# Patient Record
Sex: Male | Born: 1990 | Race: White | Hispanic: No | Marital: Single | Smoking: Never smoker
Health system: Southern US, Community
[De-identification: ages and names within clinical notes are randomized; demographics above are authoritative.]

---
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
## 2022-07-25 ENCOUNTER — Encounter: Payer: Commercial Managed Care - PPO | Admitting: Nurse Practitioner

## 2022-07-25 DIAGNOSIS — Z0000 Encounter for general adult medical examination without abnormal findings: Secondary | ICD-10-CM | POA: Insufficient documentation

## 2022-07-25 NOTE — Progress Notes (Unsigned)
  Bethanie Dicker, NP-C Phone: [phone]  Brandon Leon is a 32 y.o. male who presents today to establish care and for annual exam.  Diet: *** Exercise: *** Family history-  Prostate cancer: ***  Colon cancer: *** Sexually active: *** Vaccines-   Flu: ***  Tetanus: ***  COVID19: *** HIV screening: *** Hep C Screening: *** Tobacco use: *** Alcohol use: *** Illicit Drug use: *** Dentist: *** Ophthalmology: ***   Active Ambulatory Problems    Diagnosis Date Noted   No Active Ambulatory Problems   Resolved Ambulatory Problems    Diagnosis Date Noted   No Resolved Ambulatory Problems   No Additional Past Medical History    No family history on file.  Social History   Socioeconomic History   Marital status: Single    Spouse name: Not on file   Number of children: Not on file   Years of education: Not on file   Highest education level: Not on file  Occupational History   Not on file  Tobacco Use   Smoking status: Not on file   Smokeless tobacco: Not on file  Substance and Sexual Activity   Alcohol use: Not on file   Drug use: Not on file   Sexual activity: Not on file  Other Topics Concern   Not on file  Social History Narrative   Not on file   Social Determinants of Health   Financial Resource Strain: Not on file  Food Insecurity: Not on file  Transportation Needs: Not on file  Physical Activity: Not on file  Stress: Not on file  Social Connections: Not on file  Intimate Partner Violence: Not on file    ROS  General:  Negative for unexplained weight loss, fever Skin: Negative for new or changing mole, sore that won't heal HEENT: Negative for trouble hearing, trouble seeing, ringing in ears, mouth sores, hoarseness, change in voice, dysphagia. CV:  Negative for chest pain, dyspnea, edema, palpitations Resp: Negative for cough, dyspnea, hemoptysis GI: Negative for nausea, vomiting, diarrhea, constipation, abdominal pain, melena,  hematochezia. GU: Negative for dysuria, incontinence, urinary hesitance, hematuria, vaginal or penile discharge, polyuria, sexual difficulty, lumps in testicle or breasts MSK: Negative for muscle cramps or aches, joint pain or swelling Neuro: Negative for headaches, weakness, numbness, dizziness, passing out/fainting Psych: Negative for depression, anxiety, memory problems  Objective  Physical Exam There were no vitals filed for this visit.  BP Readings from Last 3 Encounters:  No data found for BP   Wt Readings from Last 3 Encounters:  No data found for Wt    Physical Exam   Assessment/Plan:   There are no diagnoses linked to this encounter.  No follow-ups on file.   Bethanie Dicker, NP-C [facility] [employer]

## 2023-06-21 ENCOUNTER — Ambulatory Visit
Admission: EM | Admit: 2023-06-21 | Discharge: 2023-06-21 | Disposition: A | Discharge status: Home / Self Care | Payer: Commercial Managed Care - PPO

## 2023-06-21 DIAGNOSIS — R030 Elevated blood-pressure reading, without diagnosis of hypertension: Secondary | ICD-10-CM | POA: Diagnosis not present

## 2023-06-21 DIAGNOSIS — R519 Headache, unspecified: Secondary | ICD-10-CM | POA: Diagnosis not present

## 2023-06-21 NOTE — ED Provider Notes (Signed)
 [facility]    CSN: [id number] Arrival date & time: 06/21/23  1112      History   Chief Complaint Chief Complaint  Patient presents with   Hypertension    HPI Brandon Leon is a 33 y.o. male.  Patient presents with concern for elevated blood pressure readings at home x 2 days.  He reports intermittent occasional headaches for a few months; None currently.  No chest pain, shortness of breath, numbness, weakness.  His wife had a baby 3 days ago and has been monitoring her blood pressure at home.  Patient took his blood pressure (no symptoms, just wanted to check it) and it was elevated.  This morning it was 160/95.  The highest it has been at home is systolic 165 and diastolic 110.  He does not currently have a PCP but plans to schedule an appointment to establish with PCP at [facility] where his wife goes.  He denies pertinent medical history.  The history is provided by the patient and medical records.    History reviewed. No pertinent past medical history.  Patient Active Problem List   Diagnosis Date Noted   Preventative health care 07/25/2022    History reviewed. No pertinent surgical history.     Home Medications    Prior to Admission medications   Not on File    Family History History reviewed. No pertinent family history.  Social History     Allergies   Patient has no known allergies.   Review of Systems Review of Systems  Constitutional:  Negative for chills and fever.  Respiratory:  Negative for cough and shortness of breath.   Cardiovascular:  Negative for chest pain, palpitations and leg swelling.  Neurological:  Positive for headaches. Negative for dizziness, syncope, facial asymmetry, speech difficulty, weakness, light-headedness and numbness.     Physical Exam Triage Vital Signs ED Triage Vitals  Encounter Vitals Group     BP --      Systolic BP Percentile --      Diastolic BP Percentile --      Pulse Rate 06/21/23  1129 100     Resp 06/21/23 1129 18     Temp 06/21/23 1129 97.7 F (36.5 C)     Temp src --      SpO2 06/21/23 1129 98 %     Weight --      Height --      Head Circumference --      Peak Flow --      Pain Score 06/21/23 1131 0     Pain Loc --      Pain Education --      Exclude from Growth Chart --    No data found.  Updated Vital Signs BP (!) 150/97   Pulse 100   Temp 97.7 F (36.5 C)   Resp 18   SpO2 98%   Visual Acuity Right Eye Distance:   Left Eye Distance:   Bilateral Distance:    Right Eye Near:   Left Eye Near:    Bilateral Near:     Physical Exam Constitutional:      General: He is not in acute distress. HENT:     Mouth/Throat:     Mouth: Mucous membranes are moist.  Cardiovascular:     Rate and Rhythm: Normal rate and regular rhythm.     Heart sounds: Normal heart sounds.  Pulmonary:     Effort: Pulmonary effort is normal. No respiratory distress.  Breath sounds: Normal breath sounds.  Musculoskeletal:     Right lower leg: No edema.     Left lower leg: No edema.  Neurological:     General: No focal deficit present.     Mental Status: He is alert and oriented to person, place, and time.     Sensory: No sensory deficit.     Motor: No weakness.     Gait: Gait normal.      UC Treatments / Results  Labs (all labs ordered are listed, but only abnormal results are displayed) Labs Reviewed - No data to display  EKG   Radiology No results found.  Procedures Procedures (including critical care time)  Medications Ordered in UC Medications - No data to display  Initial Impression / Assessment and Plan / UC Course  I have reviewed the triage vital signs and the nursing notes.  Pertinent labs & imaging results that were available during my care of the patient were reviewed by me and considered in my medical decision making (see chart for details).    Elevated blood pressure reading.  Episodic headaches.  Patient is currently  asymptomatic.  His wife had a baby 3 days ago and has been monitoring her blood pressures at home.  Patient decided to check his blood pressure a couple of days ago and it was elevated.  He feels anxious about this and has taken it several times since.  It has been consistently elevated.  Patient does not currently have a PCP but plans to go to [facility] tomorrow to make an appointment to establish with a PCP there because his wife goes there also.  Instructed him to monitor his blood pressure at home and keep a record of it for his new PCP.  Education provided on hypertension and DASH diet.  ED precautions given.  Patient agrees to plan of care.  Final Clinical Impressions(s) / UC Diagnoses   Final diagnoses:  Elevated blood pressure reading  Nonintractable episodic headache, unspecified headache type     Discharge Instructions      Your blood pressure is elevated today at 163/90.   See the attached information on hypertension and DASH diet.  Establish a primary care provider.    Monitor your blood pressure at home and keep a record for your new primary care provider.  Go to the emergency department if you develop concerning symptoms.       ED Prescriptions   None    PDMP not reviewed this encounter.   Corlis Burnard DEL, NP 06/21/23 1212

## 2023-06-21 NOTE — ED Triage Notes (Signed)
 Patient to Urgent Care with complaints of  high blood pressure readings at home. Reports his wife recently had a baby so he took his BP.   Symptoms started two days ago. This morning 160/95. Highest diastolic has been 100. Reports headaches over the last few months.

## 2023-06-21 NOTE — Discharge Instructions (Addendum)
 Your blood pressure is elevated today at 163/90.   See the attached information on hypertension and DASH diet.  Establish a primary care provider.    Monitor your blood pressure at home and keep a record for your new primary care provider.  Go to the emergency department if you develop concerning symptoms.

## 2023-07-26 ENCOUNTER — Ambulatory Visit
Admission: EM | Admit: 2023-07-26 | Discharge: 2023-07-26 | Disposition: A | Discharge status: Home / Self Care | Payer: Commercial Managed Care - PPO | Attending: Emergency Medicine | Admitting: Emergency Medicine

## 2023-07-26 ENCOUNTER — Encounter: Payer: Self-pay | Admitting: *Deleted

## 2023-07-26 DIAGNOSIS — R030 Elevated blood-pressure reading, without diagnosis of hypertension: Secondary | ICD-10-CM | POA: Diagnosis not present

## 2023-07-26 DIAGNOSIS — J069 Acute upper respiratory infection, unspecified: Secondary | ICD-10-CM | POA: Diagnosis not present

## 2023-07-26 LAB — POCT INFLUENZA A/B
Influenza A, POC: NEGATIVE
Influenza B, POC: NEGATIVE

## 2023-07-26 MED ORDER — BENZONATATE 100 MG PO CAPS: 100.0000 mg | ORAL_CAPSULE | Freq: Three times a day (TID) | ORAL | 0 refills | Status: AC

## 2023-07-26 NOTE — ED Triage Notes (Signed)
 Patient states 2 days of cough/congestion, Tmax 99 this morning, sick kids at home.

## 2023-07-26 NOTE — ED Provider Notes (Signed)
 Brandon Leon    CSN: [id number] Arrival date & time: 07/26/23  1145      History   Chief Complaint Chief Complaint  Patient presents with   Cough    HPI Brandon Leon is a 33 y.o. male.   33 year old male pt, Brandon Leon, presents to urgent care for cough/congestion x 2 days. Reports sick kids at home.  Patient states he is eating well, drinking well,voiding well.   The history is provided by the patient. No language interpreter was used.    History reviewed. No pertinent past medical history.  Patient Active Problem List   Diagnosis Date Noted   Viral URI with cough 07/26/2023   Elevated blood pressure reading 07/26/2023   Preventative health care 07/25/2022    History reviewed. No pertinent surgical history.     Home Medications    Prior to Admission medications   Medication Sig Start Date End Date Taking? Authorizing Provider  benzonatate (TESSALON) 100 MG capsule Take 1 capsule (100 mg total) by mouth every 8 (eight) hours for 5 days. 07/26/23 07/31/23 Yes Genavive Kubicki, Para March, NP    Family History History reviewed. No pertinent family history.  Social History Social History   Tobacco Use   Smoking status: Never   Smokeless tobacco: Never  Vaping Use   Vaping status: Never Used  Substance Use Topics   Alcohol use: Not Currently     Allergies   Patient has no known allergies.   Review of Systems Review of Systems  Constitutional:  Positive for fever.  HENT:  Positive for congestion.   Respiratory:  Positive for cough.   All other systems reviewed and are negative.    Physical Exam Triage Vital Signs ED Triage Vitals  Encounter Vitals Group     BP 07/26/23 1246 (!) 157/105     Systolic BP Percentile --      Diastolic BP Percentile --      Pulse Rate 07/26/23 1246 64     Resp 07/26/23 1246 18     Temp 07/26/23 1246 98.6 F (37 C)     Temp Source 07/26/23 1246 Oral     SpO2 07/26/23 1246 97 %     Weight 07/26/23 1244  205 lb (93 kg)     Height 07/26/23 1244 5\' 11"  (1.803 m)     Head Circumference --      Peak Flow --      Pain Score 07/26/23 1244 0     Pain Loc --      Pain Education --      Exclude from Growth Chart --    No data found.  Updated Vital Signs BP (!) 149/101 Comment: patient states running this high at home, provider notified  Pulse 64   Temp 98.6 F (37 C) (Oral)   Resp 18   Ht 5\' 11"  (1.803 m)   Wt 205 lb (93 kg)   SpO2 97%   BMI 28.59 kg/m   Visual Acuity Right Eye Distance:   Left Eye Distance:   Bilateral Distance:    Right Eye Near:   Left Eye Near:    Bilateral Near:     Physical Exam Vitals and nursing note reviewed.  Constitutional:      General: He is not in acute distress.    Appearance: He is well-developed. He is not ill-appearing or toxic-appearing.  HENT:     Head: Normocephalic.     Right Ear: Tympanic membrane is retracted.  Left Ear: Tympanic membrane is retracted.     Nose: Mucosal edema and congestion present.     Mouth/Throat:     Mouth: Mucous membranes are moist.     Pharynx: Uvula midline.  Eyes:     General: Lids are normal.     Conjunctiva/sclera: Conjunctivae normal.     Pupils: Pupils are equal, round, and reactive to light.  Cardiovascular:     Rate and Rhythm: Normal rate and regular rhythm.     Heart sounds: Normal heart sounds.  Pulmonary:     Effort: Pulmonary effort is normal. No respiratory distress.     Breath sounds: Normal breath sounds and air entry. No decreased breath sounds or wheezing.  Abdominal:     General: There is no distension.     Palpations: Abdomen is soft.  Musculoskeletal:        General: Normal range of motion.     Cervical back: Normal range of motion.  Skin:    General: Skin is warm and dry.     Findings: No rash.  Neurological:     General: No focal deficit present.     Mental Status: He is alert and oriented to person, place, and time.     GCS: GCS eye subscore is 4. GCS verbal subscore  is 5. GCS motor subscore is 6.     Cranial Nerves: No cranial nerve deficit.     Sensory: No sensory deficit.  Psychiatric:        Attention and Perception: Attention normal.        Mood and Affect: Mood normal.        Speech: Speech normal.        Behavior: Behavior normal. Behavior is cooperative.      UC Treatments / Results  Labs (all labs ordered are listed, but only abnormal results are displayed) Labs Reviewed  POCT INFLUENZA A/B - Normal    EKG   Radiology No results found.  Procedures Procedures (including critical care time)  Medications Ordered in UC Medications - No data to display  Initial Impression / Assessment and Plan / UC Course  I have reviewed the triage vital signs and the nursing notes.  Pertinent labs & imaging results that were available during my care of the patient were reviewed by me and considered in my medical decision making (see chart for details).    Discussed exam findings and plan of care with patient, strict go to ER precautions given.   Patient verbalized understanding to this provider.  Ddx: Viral URI w cough, elevated blood pressure reading Final Clinical Impressions(s) / UC Diagnoses   Final diagnoses:  Viral URI with cough  Elevated blood pressure reading     Discharge Instructions      May take tylenol, Coricidin HBP,chloraseptic throat lozenges for symptoms as label directed.   Tessalon for cough  See PCP regarding BP management  Go to Er for new or worsening issues or concerns(chest pain, palpitations, shortness of breath, etc.)     ED Prescriptions     Medication Sig Dispense Auth. Provider   benzonatate (TESSALON) 100 MG capsule Take 1 capsule (100 mg total) by mouth every 8 (eight) hours for 5 days. 15 capsule Nicholaus Steinke, Para March, NP      PDMP not reviewed this encounter.   Clancy Gourd, NP 07/26/23 1359

## 2023-07-26 NOTE — Discharge Instructions (Addendum)
 May take tylenol, Coricidin HBP,chloraseptic throat lozenges for symptoms as label directed.   Tessalon for cough  See PCP regarding BP management  Go to Er for new or worsening issues or concerns(chest pain, palpitations, shortness of breath, etc.)
